# Patient Record
Sex: Female | Born: 1990 | Race: White | Hispanic: No | Marital: Single | Smoking: Current every day smoker
Health system: Southern US, Community
[De-identification: ages and names within clinical notes are randomized; demographics above are authoritative.]

---

## 2017-07-16 ENCOUNTER — Emergency Department: Payer: Self-pay

## 2017-07-16 ENCOUNTER — Other Ambulatory Visit: Payer: Self-pay

## 2017-07-16 DIAGNOSIS — R220 Localized swelling, mass and lump, head: Secondary | ICD-10-CM | POA: Insufficient documentation

## 2017-07-16 DIAGNOSIS — S0083XA Contusion of other part of head, initial encounter: Secondary | ICD-10-CM | POA: Insufficient documentation

## 2017-07-16 DIAGNOSIS — Y999 Unspecified external cause status: Secondary | ICD-10-CM | POA: Insufficient documentation

## 2017-07-16 DIAGNOSIS — Y939 Activity, unspecified: Secondary | ICD-10-CM | POA: Insufficient documentation

## 2017-07-16 DIAGNOSIS — Y929 Unspecified place or not applicable: Secondary | ICD-10-CM | POA: Insufficient documentation

## 2017-07-16 DIAGNOSIS — F17200 Nicotine dependence, unspecified, uncomplicated: Secondary | ICD-10-CM | POA: Insufficient documentation
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
## 2017-07-17 ENCOUNTER — Emergency Department
Admission: EM | Admit: 2017-07-17 | Discharge: 2017-07-17 | Disposition: A | Discharge status: Home / Self Care | Payer: Self-pay | Attending: Emergency Medicine | Admitting: Emergency Medicine

## 2017-07-17 ENCOUNTER — Encounter: Payer: Self-pay | Admitting: Emergency Medicine

## 2017-07-17 DIAGNOSIS — S0083XA Contusion of other part of head, initial encounter: Secondary | ICD-10-CM

## 2017-07-17 DIAGNOSIS — R220 Localized swelling, mass and lump, head: Secondary | ICD-10-CM

## 2017-07-17 MED ORDER — OXYCODONE-ACETAMINOPHEN 5-325 MG PO TABS
1.0000 | ORAL_TABLET | Freq: Once | ORAL | Status: AC
  Administered 2017-07-17: 1 via ORAL
  Filled 2017-07-17: qty 1

## 2017-07-17 MED ORDER — TRAMADOL HCL 50 MG PO TABS: 50.0000 mg | ORAL_TABLET | Freq: Four times a day (QID) | ORAL | 0 refills | Status: AC | PRN

## 2017-07-17 MED ORDER — KETOROLAC TROMETHAMINE 60 MG/2ML IM SOLN
60.0000 mg | Freq: Once | INTRAMUSCULAR | Status: AC
  Administered 2017-07-17: 60 mg via INTRAMUSCULAR
  Filled 2017-07-17: qty 2
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
## 2017-07-17 NOTE — ED Notes (Signed)
Hardcopy of DC signed. 

## 2017-07-17 NOTE — Discharge Instructions (Signed)
Please follow up with your primary care physician for further evaluation. Please eat a liquid diet or soft mechanical diet to prevent jaw pain.

## 2017-12-04 ENCOUNTER — Emergency Department (HOSPITAL_COMMUNITY)
Admission: EM | Admit: 2017-12-04 | Discharge: 2017-12-04 | Disposition: A | Discharge status: Home / Self Care | Payer: Self-pay | Attending: Emergency Medicine | Admitting: Emergency Medicine

## 2017-12-04 ENCOUNTER — Other Ambulatory Visit: Payer: Self-pay

## 2017-12-04 ENCOUNTER — Encounter (HOSPITAL_COMMUNITY): Payer: Self-pay | Admitting: Emergency Medicine

## 2017-12-04 DIAGNOSIS — F17200 Nicotine dependence, unspecified, uncomplicated: Secondary | ICD-10-CM | POA: Insufficient documentation

## 2017-12-04 DIAGNOSIS — R21 Rash and other nonspecific skin eruption: Secondary | ICD-10-CM | POA: Insufficient documentation

## 2017-12-04 MED ORDER — HYDROXYZINE HCL 25 MG PO TABS: 25.0000 mg | ORAL_TABLET | Freq: Four times a day (QID) | ORAL | 0 refills | Status: AC | PRN

## 2017-12-04 MED ORDER — PREDNISONE 10 MG (21) PO TBPK: ORAL_TABLET | Freq: Every day | ORAL | 0 refills | Status: AC

## 2017-12-04 MED ORDER — PREDNISONE 20 MG PO TABS
60.0000 mg | ORAL_TABLET | Freq: Once | ORAL | Status: AC
  Administered 2017-12-04: 60 mg via ORAL
  Filled 2017-12-04: qty 3

## 2017-12-04 NOTE — ED Triage Notes (Signed)
Pt presents with what appears to be bug bites to bilateral arms and back present X3 days. Pt states she checked her house for bed bugs but found no evidence of any.

## 2017-12-04 NOTE — ED Provider Notes (Signed)
[facility] Provider Note   CSN: [id number] Arrival date & time: 12/04/17  2041     History   Chief Complaint Chief Complaint  Patient presents with  . Insect Bite    HPI Kimberly Reese is a 27 y.o. female who presents to ED for evaluation of rash.  States that 3 days ago, she began having "red spots" on her arms.  Is now spread to her right shoulder.  She is unsure what causes of symptoms.  She initially thought that it was due to bedbugs or other bugs but she search her house and was unable to find it.  Symptoms also occur at work.  She tried Benadryl with no improvement in symptoms.  She states that the rash is itchy.  She cannot recall any new soaps, lotions, detergents or environmental exposures, prior history of similar symptoms, known allergens, lip swelling or trouble breathing.  HPI  History reviewed. No pertinent past medical history.  There are no active problems to display for this patient.   History reviewed. No pertinent surgical history.   OB History   None      Home Medications    Prior to Admission medications   Medication Sig Start Date End Date Taking? Authorizing Provider  hydrOXYzine (ATARAX/VISTARIL) 25 MG tablet Take 1 tablet (25 mg total) by mouth every 6 (six) hours as needed for itching. 12/04/17   Keyly Baldonado, PA-C  predniSONE (STERAPRED UNI-PAK 21 TAB) 10 MG (21) TBPK tablet Take by mouth daily. Take 5 tabs for 2 days, then 4 tabs for 2 days, then 3 tabs for 2 days, 2 tabs for 2 days, then 1 tab by mouth daily for 2 days 12/04/17   Idelle LeechKhatri, Yevonne Yokum, PA-C  traMADol (ULTRAM) 50 MG tablet Take 1 tablet (50 mg total) by mouth every 6 (six) hours as needed. 07/17/17   Rebecka ApleyWebster, Allison P, MD    Family History No family history on file.  Social History Social History   Tobacco Use  . Smoking status: Current Every Day Smoker  . Smokeless tobacco: Never Used  Substance Use Topics  . Alcohol use: Yes  . Drug  use: Not Currently     Allergies   Patient has no known allergies.   Review of Systems Review of Systems  Constitutional: Negative for chills and fever.  HENT: Negative for facial swelling.   Respiratory: Negative for shortness of breath.   Gastrointestinal: Negative for nausea and vomiting.  Musculoskeletal: Negative for myalgias, neck pain and neck stiffness.  Skin: Positive for rash.  Neurological: Negative for weakness and headaches.     Physical Exam Updated Vital Signs BP 124/81 (BP Location: Right Arm)   Pulse 98   Temp 98.7 F (37.1 C) (Oral)   Resp 16   Ht 5\' 4"  (1.626 m)   Wt 68 kg (150 lb)   SpO2 98%   BMI 25.75 kg/m   Physical Exam  Constitutional: She appears well-developed and well-nourished. No distress.  HENT:  Head: Normocephalic and atraumatic.  Eyes: Conjunctivae and EOM are normal. No scleral icterus.  Neck: Normal range of motion.  No meningismus.  Pulmonary/Chest: Effort normal. No respiratory distress.  Neurological: She is alert.  Skin: Rash noted. She is not diaphoretic.  Large, erythematous papules noted to right back/shoulder.  Smaller areas noted on right upper extremity.  Associated excoriation noted.  No blistering, pustules or drainage noted.  Not present on palms.  Psychiatric: She has a normal mood and  affect.  Nursing note and vitals reviewed.    ED Treatments / Results  Labs (all labs ordered are listed, but only abnormal results are displayed) Labs Reviewed - No data to display  EKG None  Radiology No results found.  Procedures Procedures (including critical care time)  Medications Ordered in ED Medications  predniSONE (DELTASONE) tablet 60 mg (60 mg Oral Given 12/04/17 2117)     Initial Impression / Assessment and Plan / ED Course  I have reviewed the triage vital signs and the nursing notes.  Pertinent labs & imaging results that were available during my care of the patient were reviewed by me and considered  in my medical decision making (see chart for details).     Patient presents to ED for evaluation of rash for the past 3 days.  On physical exam there are large erythematous papules on R shoulder and upper extremities. Pt has a patent airway without stridor and is handling secretions without difficulty; no angioedema. No blisters, no pustules, no warmth, no draining sinus tracts, no superficial abscesses, no bullous impetigo, no vesicles, no desquamation, no target lesions with dusky purpura or a central bulla. Not tender to touch. No concern for superimposed infection. No concern for SJS, TEN, TSS, tick borne illness, syphilis or other life-threatening condition.  Treatment with prednisone Dosepak, hydroxyzine for itching.  Advised to return to ED for any severe worsening symptoms.  Portions of this note were generated with Scientist, clinical (histocompatibility and immunogenetics). Dictation errors may occur despite best attempts at proofreading.   Final Clinical Impressions(s) / ED Diagnoses   Final diagnoses:  Rash    ED Discharge Orders        Ordered    predniSONE (STERAPRED UNI-PAK 21 TAB) 10 MG (21) TBPK tablet  Daily     12/04/17 2118    hydrOXYzine (ATARAX/VISTARIL) 25 MG tablet  Every 6 hours PRN     12/04/17 2118       Dietrich Pates, PA-C 12/04/17 2124    Wynetta Fines, MD 12/04/17 2358
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
